# Patient Record
Sex: Male | Born: 2000 | Race: White | Hispanic: No | Marital: Single | Smoking: Never smoker
Health system: Southern US, Community
[De-identification: ages and names within clinical notes are randomized; demographics above are authoritative.]

## PROBLEM LIST (undated history)

## (undated) HISTORY — PX: TESTICLE SURGERY: SHX794

## (undated) HISTORY — PX: TYMPANOSTOMY TUBE PLACEMENT: SHX32

---
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
## 2018-04-17 ENCOUNTER — Ambulatory Visit (INDEPENDENT_AMBULATORY_CARE_PROVIDER_SITE_OTHER): Payer: Self-pay | Admitting: Family Medicine

## 2018-04-17 VITALS — BP 116/77 | HR 76 | Ht 72.0 in | Wt 130.0 lb

## 2018-04-17 DIAGNOSIS — M25562 Pain in left knee: Secondary | ICD-10-CM

## 2018-04-17 NOTE — Patient Instructions (Signed)
You have a knee contusion of your lateral patella and lateral femoral condyle. Wear knee brace when up and walking around for the next month. Icing 15 minutes at a time 3-4 times a day. Ibuprofen 600mg  three times a day with food for pain and inflammation as needed. Straight leg raises, knee extensions, hamstring curls 3 sets of 10 once a day. Follow up with me in 1 month for reevaluation.

## 2018-04-17 NOTE — Progress Notes (Signed)
PCP: Joanna HewsJedlica, Michele, MD  Subjective:   HPI: Patient is a 17 y.o. male here for left knee pain for 1 week.  Patient reports 5/10 knee pain along the lateral patella.  He states that one week ago he was walking on his front porch and slipped and nearly did a split.  He did not completely fall.  About 2 days later he began to notice the pain.  He denies any swelling.  He denies any erythema or bruising.  No reported numbness or tingling.  He denies subluxation or dislocation of the patella.  He has been wearing a compression knee sleeve which he says is helpful.  He is also been taking ibuprofen as needed.    No past medical history on file.  No current outpatient medications on file prior to visit.   No current facility-administered medications on file prior to visit.     Past Surgical History:  Procedure Laterality Date  . TESTICLE SURGERY     undescended testicle reduction  . TYMPANOSTOMY TUBE PLACEMENT      No Known Allergies  Social History   Socioeconomic History  . Marital status: Single    Spouse name: Not on file  . Number of children: Not on file  . Years of education: Not on file  . Highest education level: Not on file  Occupational History  . Not on file  Social Needs  . Financial resource strain: Not on file  . Food insecurity:    Worry: Not on file    Inability: Not on file  . Transportation needs:    Medical: Not on file    Non-medical: Not on file  Tobacco Use  . Smoking status: Never Smoker  . Smokeless tobacco: Never Used  Substance and Sexual Activity  . Alcohol use: Not on file  . Drug use: Not on file  . Sexual activity: Not on file  Lifestyle  . Physical activity:    Days per week: Not on file    Minutes per session: Not on file  . Stress: Not on file  Relationships  . Social connections:    Talks on phone: Not on file    Gets together: Not on file    Attends religious service: Not on file    Active member of club or organization: Not  on file    Attends meetings of clubs or organizations: Not on file    Relationship status: Not on file  . Intimate partner violence:    Fear of current or ex partner: Not on file    Emotionally abused: Not on file    Physically abused: Not on file    Forced sexual activity: Not on file  Other Topics Concern  . Not on file  Social History Narrative  . Not on file    Family History  Problem Relation Age of Onset  . Sudden death Neg Hx   . Hypertension Neg Hx   . Hyperlipidemia Neg Hx   . Heart attack Neg Hx   . Diabetes Neg Hx     BP 116/77   Pulse 76   Ht 6' (1.829 m)   Wt 130 lb (59 kg)   BMI 17.63 kg/m   Review of Systems: See HPI above.     Objective:  Physical Exam:  Gen: NAD, comfortable in exam room Pulm: breathing unlabored  Left Knee: - Inspection: no gross deformity, swelling or eythema is noted - Palpation: Tenderness along the lateral border of the patella. -  ROM: full active ROM with flexion and extension in knee - Strength: full strength on resisted flexion and extension - Neuro/vasc: Normal sensation - Special Tests: - LIGAMENTS: negative anterior and posterior drawer, negative Lachman's, no MCL or LCL laxity  -- MENISCUS: negative McMurray's, negative Thessaly  -- PF JOINT: nml patellar mobility bilaterally.  Negative patellar apprehension  Right knee: No deformity. FROM with 5/5 strength. No tenderness to palpation. NVI distally.   Assessment & Plan:  1.  Left knee pain-likely secondary to bone contusion.  No ligamentous instability on exam. - Continue to wear compression knee sleeve if it is helpful - NSAIDs such as naproxen 500 mg twice daily or ibuprofen 600 800 mg 3 times daily as needed for pain - Ice 15 minutes 3-4 times daily - Discussed and instructed on basic quadricep and hamstring exercises -Follow-up in 4 weeks

## 2018-04-18 ENCOUNTER — Encounter: Payer: Self-pay | Admitting: Family Medicine

## 2018-05-18 ENCOUNTER — Ambulatory Visit: Payer: Medicaid Other | Admitting: Family Medicine

## 2020-03-15 ENCOUNTER — Other Ambulatory Visit: Payer: Self-pay

## 2020-03-15 ENCOUNTER — Encounter (HOSPITAL_BASED_OUTPATIENT_CLINIC_OR_DEPARTMENT_OTHER): Payer: Self-pay | Admitting: Emergency Medicine

## 2020-03-15 ENCOUNTER — Emergency Department (HOSPITAL_BASED_OUTPATIENT_CLINIC_OR_DEPARTMENT_OTHER)
Admission: EM | Admit: 2020-03-15 | Discharge: 2020-03-15 | Disposition: A | Discharge status: Home / Self Care | Payer: Medicaid Other | Attending: Emergency Medicine | Admitting: Emergency Medicine

## 2020-03-15 DIAGNOSIS — Y929 Unspecified place or not applicable: Secondary | ICD-10-CM | POA: Insufficient documentation

## 2020-03-15 DIAGNOSIS — Y939 Activity, unspecified: Secondary | ICD-10-CM | POA: Insufficient documentation

## 2020-03-15 DIAGNOSIS — Y999 Unspecified external cause status: Secondary | ICD-10-CM | POA: Insufficient documentation

## 2020-03-15 DIAGNOSIS — S61211A Laceration without foreign body of left index finger without damage to nail, initial encounter: Secondary | ICD-10-CM | POA: Insufficient documentation

## 2020-03-15 DIAGNOSIS — Z23 Encounter for immunization: Secondary | ICD-10-CM | POA: Insufficient documentation

## 2020-03-15 DIAGNOSIS — S61215A Laceration without foreign body of left ring finger without damage to nail, initial encounter: Secondary | ICD-10-CM

## 2020-03-15 DIAGNOSIS — X58XXXA Exposure to other specified factors, initial encounter: Secondary | ICD-10-CM | POA: Insufficient documentation

## 2020-03-15 MED ORDER — TETANUS-DIPHTH-ACELL PERTUSSIS 5-2.5-18.5 LF-MCG/0.5 IM SUSP
0.5000 mL | Freq: Once | INTRAMUSCULAR | Status: AC
  Administered 2020-03-15: 0.5 mL via INTRAMUSCULAR
  Filled 2020-03-15: qty 0.5

## 2020-03-15 MED ORDER — LIDOCAINE-EPINEPHRINE (PF) 2 %-1:200000 IJ SOLN
20.0000 mL | Freq: Once | INTRAMUSCULAR | Status: DC
  Filled 2020-03-15: qty 20

## 2020-03-15 NOTE — ED Notes (Signed)
Irrigated wound with of NS, applied bandaid

## 2020-03-15 NOTE — ED Triage Notes (Signed)
Pt c/o laceration to left lateral 4th digit 2 hrs pta on dog food can. Last tetanus unknown. Bandage applied pta, bleeding controlled

## 2020-03-15 NOTE — ED Provider Notes (Signed)
[facility] EMERGENCY DEPARTMENT Provider Note  CSN: [id number] Arrival date & time: 03/15/20 2018  Chief Complaint(s) Laceration  HPI Donald Roman is a 19 y.o. male here for left ring finger laceration that occurred 4 hours prior to arrival on the inside of a metal dog food can.  Bleeding was not controlled which prompted his visit.  He denies any other injuries or physical complaints.  Unsure of tetanus status.  He does not have any associated pain, no numbness or tingling.  Bleeding is now controlled.  HPI  Past Medical History History reviewed. No pertinent past medical history. Patient Active Problem List   Diagnosis Date Noted  . Right leg pain 10/27/2016  . Left knee pain 08/25/2012   Home Medication(s) Prior to Admission medications   Not on File                                                                                                                                    Past Surgical History Past Surgical History:  Procedure Laterality Date  . TESTICLE SURGERY     undescended testicle reduction  . TYMPANOSTOMY TUBE PLACEMENT     Family History Family History  Problem Relation Age of Onset  . Sudden death Neg Hx   . Hypertension Neg Hx   . Hyperlipidemia Neg Hx   . Heart attack Neg Hx   . Diabetes Neg Hx     Social History Social History   Tobacco Use  . Smoking status: Never Smoker  . Smokeless tobacco: Never Used  Substance Use Topics  . Alcohol use: Never    Alcohol/week: 0.0 standard drinks  . Drug use: Never   Allergies Patient has no known allergies.  Review of Systems Review of Systems All other systems are reviewed and are negative for acute change except as noted in the HPI  Physical Exam Vital Signs  I have reviewed the triage vital signs BP 138/78 (BP Location: Right Arm)   Pulse 73   Temp 99.2 F (37.3 C) (Oral)   Resp 18   Ht 6' (1.829 m)   Wt 65.8 kg   SpO2 100%   BMI 19.67 kg/m   Physical Exam Vitals  reviewed.  Constitutional:      General: He is not in acute distress.    Appearance: He is well-developed. He is not diaphoretic.  HENT:     Head: Normocephalic and atraumatic.     Jaw: No trismus.     Right Ear: External ear normal.     Left Ear: External ear normal.     Nose: Nose normal.  Eyes:     General: No scleral icterus.    Conjunctiva/sclera: Conjunctivae normal.  Neck:     Trachea: Phonation normal.  Cardiovascular:     Rate and Rhythm: Normal rate and regular rhythm.  Pulmonary:     Effort: Pulmonary effort is normal. No respiratory distress.  Breath sounds: No stridor.  Abdominal:     General: There is no distension.  Musculoskeletal:        General: Normal range of motion.     Left hand: Laceration present.       Hands:     Cervical back: Normal range of motion.  Neurological:     Mental Status: He is alert and oriented to person, place, and time.  Psychiatric:        Behavior: Behavior normal.     ED Results and Treatments Labs (all labs ordered are listed, but only abnormal results are displayed) Labs Reviewed - No data to display                                                                                                                       EKG  EKG Interpretation  Date/Time:    Ventricular Rate:    PR Interval:    QRS Duration:   QT Interval:    QTC Calculation:   R Axis:     Text Interpretation:        Radiology No results found.  Pertinent labs & imaging results that were available during my care of the patient were reviewed by me and considered in my medical decision making (see chart for details).  Medications Ordered in ED Medications  lidocaine-EPINEPHrine (XYLOCAINE W/EPI) 2 %-1:200000 (PF) injection 20 mL (has no administration in time range)  Tdap (BOOSTRIX) injection 0.5 mL (0.5 mLs Intramuscular Given 03/15/20 2302)                                                                                                                                     Procedures Procedures  (including critical care time)  Medical Decision Making / ED Course I have reviewed the nursing notes for this encounter and the patient's prior records (if available in EHR or on provided paperwork).   Donald Roman was evaluated in Emergency Department on 03/15/2020 for the symptoms described in the history of present illness. He was evaluated in the context of the global COVID-19 pandemic, which necessitated consideration that the patient might be at risk for infection with the SARS-CoV-2 virus that causes COVID-19. Institutional protocols and algorithms that pertain to the evaluation of patients at risk for COVID-19 are in a state of rapid change based on information released by regulatory bodies including the CDC and federal and state organizations.  These policies and algorithms were followed during the patient's care in the ED.  Finger laceration irrigated.  No need for sutures at this time.  Will allow for secondary closure.  Tetanus updated.      Final Clinical Impression(s) / ED Diagnoses Final diagnoses:  Laceration of left ring finger without foreign body without damage to nail, initial encounter   The patient appears reasonably screened and/or stabilized for discharge and I doubt any other medical condition or other [facility] requiring further screening, evaluation, or treatment in the ED at this time prior to discharge. Safe for discharge with strict return precautions.  Disposition: Discharge  Condition: Good  I have discussed the results, Dx and Tx plan with the patient/family who expressed understanding and agree(s) with the plan. Discharge instructions discussed at length. The patient/family was given strict return precautions who verbalized understanding of the instructions. No further questions at time of discharge.    ED Discharge Orders    None       Follow Up: Assunta Gambles, MD [address] [place] [address] [phone]  Call  As needed      This chart was dictated using voice recognition software.  Despite best efforts to proofread,  errors can occur which can change the documentation meaning.   Fatima Blank, MD 03/15/20 2329

## 2021-01-08 ENCOUNTER — Other Ambulatory Visit: Payer: Self-pay

## 2021-01-08 ENCOUNTER — Encounter (HOSPITAL_BASED_OUTPATIENT_CLINIC_OR_DEPARTMENT_OTHER): Payer: Self-pay

## 2021-01-08 ENCOUNTER — Emergency Department (HOSPITAL_BASED_OUTPATIENT_CLINIC_OR_DEPARTMENT_OTHER)
Admission: EM | Admit: 2021-01-08 | Discharge: 2021-01-08 | Disposition: A | Discharge status: Home / Self Care | Payer: Self-pay | Attending: Emergency Medicine | Admitting: Emergency Medicine

## 2021-01-08 ENCOUNTER — Emergency Department (HOSPITAL_BASED_OUTPATIENT_CLINIC_OR_DEPARTMENT_OTHER): Payer: Self-pay

## 2021-01-08 DIAGNOSIS — M25561 Pain in right knee: Secondary | ICD-10-CM | POA: Insufficient documentation

## 2021-01-08 NOTE — ED Provider Notes (Signed)
[facility] EMERGENCY DEPARTMENT Provider Note   CSN: [id number] Arrival date & time: 01/08/21  1443     History Chief Complaint  Patient presents with  . Knee Pain    Donald Roman is a 20 y.o. male.  The history is provided by the patient.  Knee Pain Location:  Knee Knee location:  R knee Pain details:    Quality:  Aching   Severity:  Mild   Onset quality:  Gradual   Timing:  Intermittent   Progression:  Waxing and waning Chronicity:  New Relieved by:  Nothing Worsened by:  Bearing weight Associated symptoms: no decreased ROM, no fever, no muscle weakness, no numbness, no swelling and no tingling        History reviewed. No pertinent past medical history.  Patient Active Problem List   Diagnosis Date Noted  . Right leg pain 10/27/2016  . Left knee pain 08/25/2012    Past Surgical History:  Procedure Laterality Date  . TESTICLE SURGERY     undescended testicle reduction  . TYMPANOSTOMY TUBE PLACEMENT         Family History  Problem Relation Age of Onset  . Sudden death Neg Hx   . Hypertension Neg Hx   . Hyperlipidemia Neg Hx   . Heart attack Neg Hx   . Diabetes Neg Hx     Social History   Tobacco Use  . Smoking status: Never Smoker  . Smokeless tobacco: Never Used  Substance Use Topics  . Alcohol use: Never    Alcohol/week: 0.0 standard drinks  . Drug use: Never    Home Medications Prior to Admission medications   Not on File    Allergies    Patient has no known allergies.  Review of Systems   Review of Systems  Constitutional: Negative for fever.  Musculoskeletal: Positive for arthralgias. Negative for joint swelling and myalgias.  Skin: Negative for color change, rash and wound.  Neurological: Negative for weakness and numbness.    Physical Exam Updated Vital Signs BP 121/80 (BP Location: Right Arm)   Pulse 67   Temp 98.1 F (36.7 C) (Oral)   Resp 18   Ht 6' (1.829 m)   Wt 59.4 kg   SpO2 100%   BMI  17.77 kg/m   Physical Exam Constitutional:      General: He is not in acute distress. Cardiovascular:     Pulses: Normal pulses.  Musculoskeletal:        General: Tenderness (right knee) present. No swelling or deformity. Normal range of motion.     Comments: Tenderness to the posterior portion of the knee but no obvious laxity, swelling, good range of motion but with pain  Skin:    Capillary Refill: Capillary refill takes less than 2 seconds.     Findings: No erythema.  Neurological:     General: No focal deficit present.     Mental Status: He is alert.     Sensory: No sensory deficit.     Motor: No weakness.     ED Results / Procedures / Treatments   Labs (all labs ordered are listed, but only abnormal results are displayed) Labs Reviewed - No data to display  EKG None  Radiology No results found.  Procedures Procedures   Medications Ordered in ED Medications - No data to display  ED Course  I have reviewed the triage vital signs and the nursing notes.  Pertinent labs & imaging results that were available during my  care of the patient were reviewed by me and considered in my medical decision making (see chart for details).    MDM Rules/Calculators/A&P                          Donald Roman is here with right knee pain after a fall.  Twisted his right knee after falling over his cat.  Has been able to bear weight with knee sleeve.  Has had previous knee sprain in the past.  X-ray showed no fracture.  Neurovascularly neuromuscularly intact.  Overall suspect mild knee sprain.  We will have him follow-up with sports medicine/primary care doctor.  Discharged in good condition.  Understands return precautions.  This chart was dictated using voice recognition software.  Despite best efforts to proofread,  errors can occur which can change the documentation meaning.    Final Clinical Impression(s) / ED Diagnoses Final diagnoses:  Acute pain of right knee    Rx  / DC Orders ED Discharge Orders    None       Virgina Norfolk, DO 01/08/21 [phone]

## 2021-01-08 NOTE — ED Notes (Signed)
ED Provider at bedside. 

## 2021-01-08 NOTE — Discharge Instructions (Addendum)
Recommend 800 mg of Motrin every 8 hours as needed for pain.  Recommend 1000 mg of Tylenol every 6 hours as needed for pain.  Recommend ice.  Activity as tolerated with crutches and knee sleeve.  Follow-up with your primary care doctor or sports medicine.

## 2021-01-08 NOTE — ED Triage Notes (Signed)
Sates fell over a cat.  Twisted knee and fell pain.  Previous knee injury strain knee

## 2021-11-05 IMAGING — CR DG KNEE COMPLETE 4+V*R*
4 series · 4 of 4 positions shown · non-contrast
Comparison: 10/25/2013 contralateral knee

CLINICAL DATA: Fall with right knee pain

EXAM:
RIGHT KNEE - COMPLETE 4+ VIEW

[t knee ap right]
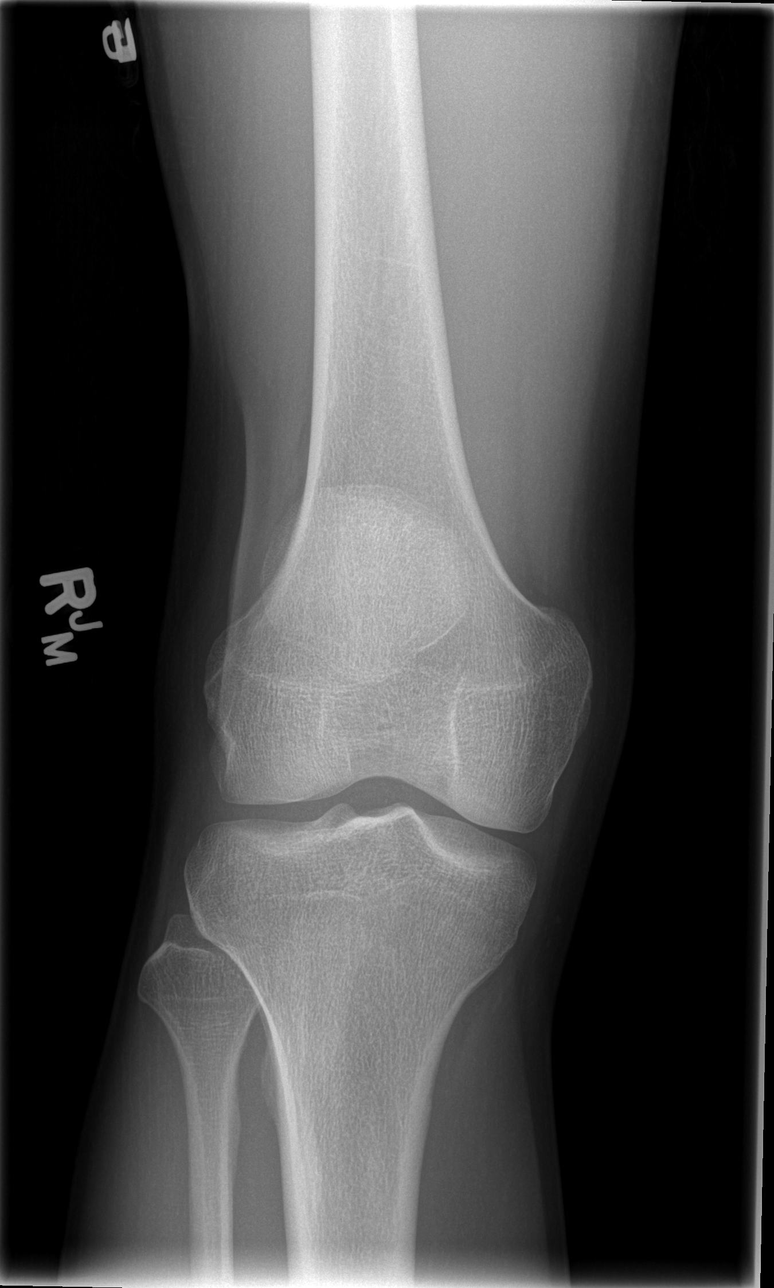

[t knee oblique right (1 of 2)]
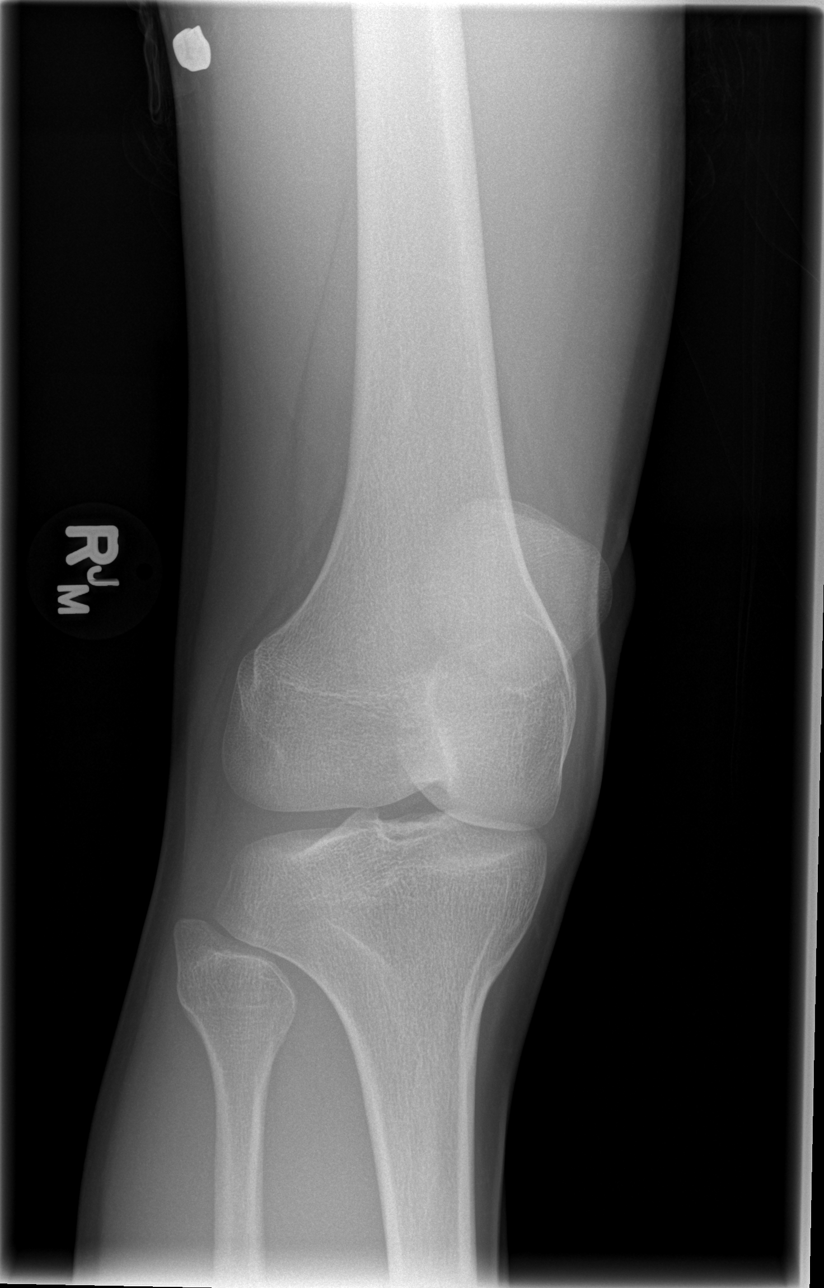

[t knee oblique right (2 of 2)]
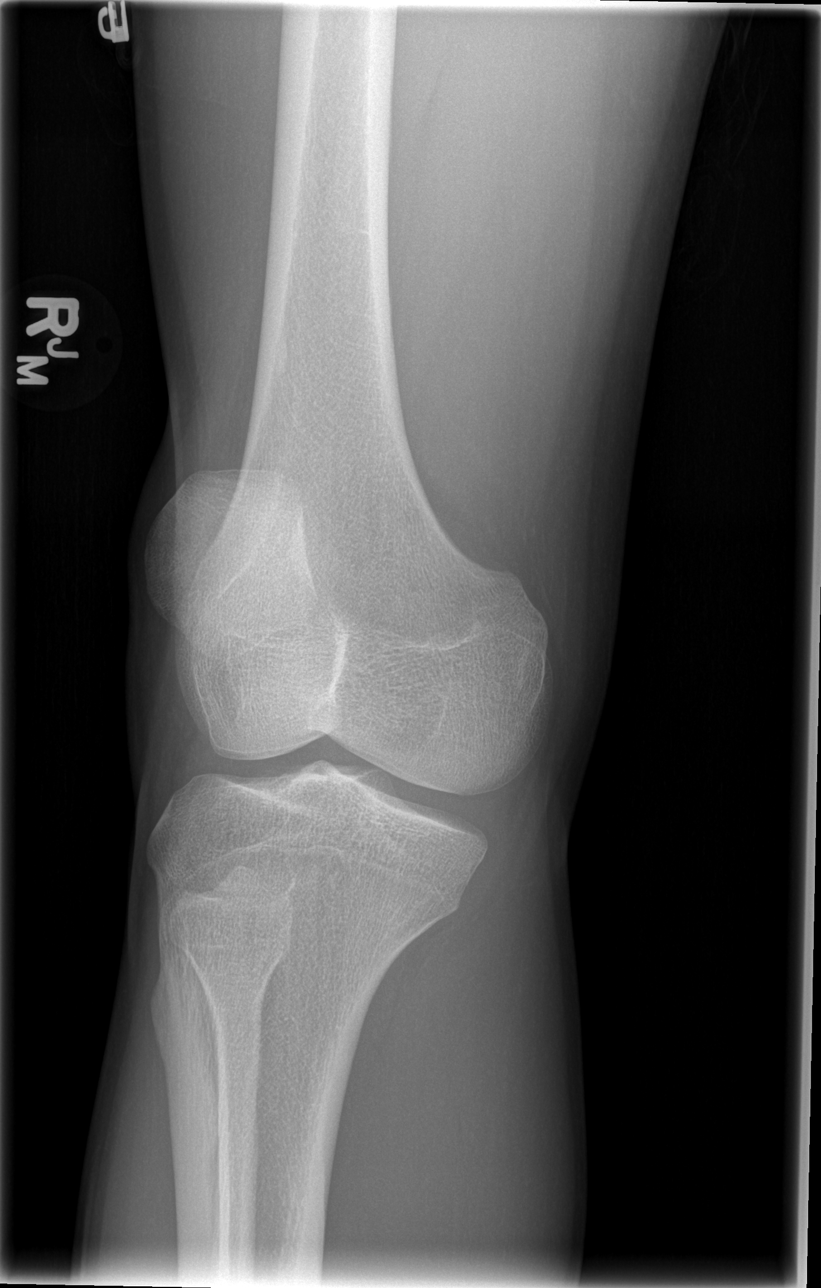

[t knee lat right]
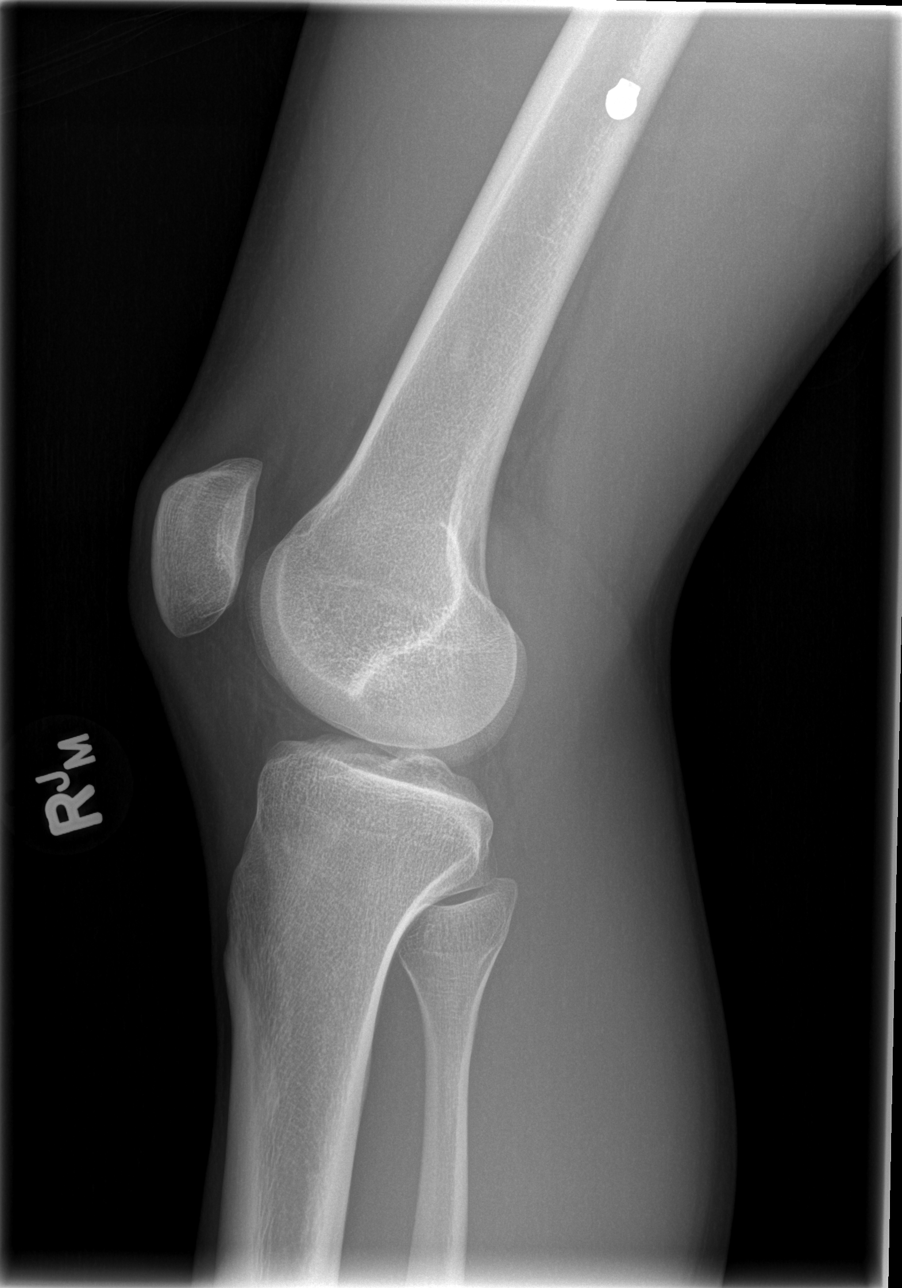

[4 of 4 positions shown; findings below may reference images not displayed]

FINDINGS: No evidence of fracture, dislocation, or joint effusion. No evidence
of arthropathy or other focal bone abnormality. Soft tissues are
unremarkable.
IMPRESSION: Negative.

## 2022-03-19 ENCOUNTER — Emergency Department (HOSPITAL_BASED_OUTPATIENT_CLINIC_OR_DEPARTMENT_OTHER): Payer: Self-pay

## 2022-03-19 ENCOUNTER — Emergency Department (HOSPITAL_BASED_OUTPATIENT_CLINIC_OR_DEPARTMENT_OTHER)
Admission: EM | Admit: 2022-03-19 | Discharge: 2022-03-19 | Disposition: A | Discharge status: Home / Self Care | Payer: Self-pay | Attending: Emergency Medicine | Admitting: Emergency Medicine

## 2022-03-19 ENCOUNTER — Encounter (HOSPITAL_BASED_OUTPATIENT_CLINIC_OR_DEPARTMENT_OTHER): Payer: Self-pay | Admitting: Emergency Medicine

## 2022-03-19 ENCOUNTER — Other Ambulatory Visit: Payer: Self-pay

## 2022-03-19 DIAGNOSIS — S9031XA Contusion of right foot, initial encounter: Secondary | ICD-10-CM

## 2022-03-19 DIAGNOSIS — S99921A Unspecified injury of right foot, initial encounter: Secondary | ICD-10-CM | POA: Insufficient documentation

## 2022-03-19 DIAGNOSIS — W208XXA Other cause of strike by thrown, projected or falling object, initial encounter: Secondary | ICD-10-CM | POA: Insufficient documentation

## 2022-03-19 NOTE — Discharge Instructions (Addendum)
Recommend ice, Tylenol, ibuprofen.  Recommend wearing postop shoe and use crutches and minimally bear weight.  Follow-up with sports medicine if you are having persistent pain.

## 2022-03-19 NOTE — ED Triage Notes (Signed)
Patient presents to ED via POV from work. Patient reports right foot pain/swelling. Reports today at work he dropped a fan on top of his foot. Reports pain and swelling was present prior to this. Ambulatory with limping gait.

## 2022-03-19 NOTE — ED Notes (Signed)
AVS provided to client along with work note, instructions provided on implementing elevation, ice pack and using tylenol/ ibuprofen. Also reinforced MD recommendations to make follow up appt with Sports Medicine MD here at [facility]. Opportunity for questions provided prior to DC to home

## 2022-03-19 NOTE — ED Provider Notes (Signed)
[facility] EMERGENCY DEPARTMENT Provider Note   CSN: [id number] Arrival date & time: 03/19/22  1249     History  Chief Complaint  Patient presents with   Foot Pain    Donald Roman is a 21 y.o. male.  Here with right foot pain after about a 30 pound fan fell on his right foot at work.  He has been ambulatory since.  Just occurred.  Nothing makes it worse or better.  No medical problems.  Denies any numbness or weakness.  Did not hit his head.  Did not injure anything else.  The history is provided by the patient.       Home Medications Prior to Admission medications   Not on File      Allergies    Patient has no known allergies.    Review of Systems   Review of Systems  Physical Exam Updated Vital Signs BP 132/88   Pulse 68   Temp 98 F (36.7 C) (Oral)   Resp 15   SpO2 100%  Physical Exam Constitutional:      General: He is not in acute distress.    Appearance: He is not ill-appearing.  Cardiovascular:     Pulses: Normal pulses.     Heart sounds: Normal heart sounds.  Musculoskeletal:        General: Swelling and tenderness present. No deformity.     Comments: Tenderness and some swelling to the right top of his foot with no obvious deformity  Skin:    General: Skin is warm.  Neurological:     General: No focal deficit present.     Mental Status: He is alert.     Motor: No weakness.     ED Results / Procedures / Treatments   Labs (all labs ordered are listed, but only abnormal results are displayed) Labs Reviewed - No data to display  EKG None  Radiology DG Foot Complete Right  Result Date: 03/19/2022 CLINICAL DATA:  Trauma, pain EXAM: RIGHT FOOT COMPLETE - 3+ VIEW COMPARISON:  None Available. FINDINGS: There is no evidence of fracture or dislocation. There is no evidence of arthropathy or other focal bone abnormality. There is soft tissue swelling over the dorsum. IMPRESSION: No fracture or dislocation is seen in the right  foot. Electronically Signed   By: Ernie Avena M.D.   On: 03/19/2022 13:43    Procedures Procedures    Medications Ordered in ED Medications - No data to display  ED Course/ Medical Decision Making/ A&P                           Medical Decision Making Amount and/or Complexity of Data Reviewed Radiology: ordered.   Donald Roman is here after suffering a right foot injury.  Normal vitals.  A fan fell on his right foot.  He has tenderness and some redness and swelling in this area.  X-ray shows no fracture or dislocation.  He is neurovascular neuromuscular intact.  Overall suspect contusion.  Less likely ligamentous injury.  We will have him follow-up with sports medicine.  Recommend ice, Tylenol, ibuprofen.  Placed in a postop shoe.  Recommend to do bear weight as tolerated.  Discharged in good condition.  Understands return precautions.  This chart was dictated using voice recognition software.  Despite best efforts to proofread,  errors can occur which can change the documentation meaning.         Final Clinical Impression(s) /  ED Diagnoses Final diagnoses:  Contusion of right foot, initial encounter    Rx / DC Orders ED Discharge Orders     None         Virgina Norfolk, DO 03/19/22 1413

## 2022-03-25 ENCOUNTER — Encounter: Payer: Self-pay | Admitting: Family Medicine

## 2022-03-25 ENCOUNTER — Ambulatory Visit (INDEPENDENT_AMBULATORY_CARE_PROVIDER_SITE_OTHER): Payer: Self-pay | Admitting: Family Medicine

## 2022-03-25 ENCOUNTER — Ambulatory Visit: Payer: Self-pay

## 2022-03-25 VITALS — BP 128/70 | Ht 72.0 in | Wt 135.0 lb

## 2022-03-25 DIAGNOSIS — M79671 Pain in right foot: Secondary | ICD-10-CM

## 2022-03-25 DIAGNOSIS — S9031XA Contusion of right foot, initial encounter: Secondary | ICD-10-CM

## 2022-03-25 NOTE — Patient Instructions (Signed)
Nice to meet you Please use the postop shoe and crutches if having pain with weightbearing. Please alternate ibuprofen and Tylenol. Please use icing as needed. Please send me a message in MyChart with any questions or updates.  Please see me back in 3 weeks.   --Dr. Boies Likes

## 2022-03-25 NOTE — Assessment & Plan Note (Signed)
Acutely occurring.  Having significant hyperemia overlying the third metatarsal after his trauma. -Counseled on home exercise therapy and supportive care. -Counseled on postop shoe and crutches. -Provided work note. -Could consider physical therapy or further imaging.

## 2022-03-25 NOTE — Progress Notes (Signed)
  Donald Roman - 21 y.o. male MRN [id number]  Date of birth: 12-24-2000  SUBJECTIVE:  Including CC & ROS.  No chief complaint on file.   Donald Roman is a 21 y.o. male that is presenting with right foot pain following an injury sustained at work.  A fan fell on the dorsum of his midfoot.  Since that time he has had swelling and pain with weightbearing..  Review of the emergency department note from 6/30 shows he was counseled supportive care. Independent review of the right foot x-ray from 6/30 shows no acute bony changes.  Review of Systems See HPI   HISTORY: Past Medical, Surgical, Social, and Family History Reviewed & Updated per EMR.   Pertinent Historical Findings include:  History reviewed. No pertinent past medical history.  Past Surgical History:  Procedure Laterality Date   TESTICLE SURGERY     undescended testicle reduction   TYMPANOSTOMY TUBE PLACEMENT       PHYSICAL EXAM:  VS: BP 128/70 (BP Location: Left Arm, Patient Position: Sitting)   Ht 6' (1.829 m)   Wt 135 lb (61.2 kg)   BMI 18.31 kg/m  Physical Exam Gen: NAD, alert, cooperative with exam, well-appearing MSK: Neurovascularly intact    Limited ultrasound: Right foot:  Hematoma overlying the soft tissue above the fifth metatarsal base. Increased hyperemia of the midshaft of the third metatarsal. No changes appreciated in the midfoot  Summary: Findings most consistent with a nondisplaced third metatarsal fracture versus a bony contusion of the third metatarsal  Ultrasound and interpretation by Clare Gandy, MD    ASSESSMENT & PLAN:   Contusion of right foot Acutely occurring.  Having significant hyperemia overlying the third metatarsal after his trauma. -Counseled on home exercise therapy and supportive care. -Counseled on postop shoe and crutches. -Provided work note. -Could consider physical therapy or further imaging.

## 2022-04-19 ENCOUNTER — Ambulatory Visit: Payer: Medicaid Other | Admitting: Family Medicine

## 2023-01-05 ENCOUNTER — Encounter: Payer: Self-pay | Admitting: *Deleted
